# Patient Record
Sex: Male | Born: 1985 | Race: White | Hispanic: Yes | Marital: Married
Health system: Southern US, Community
[De-identification: ages and names within clinical notes are randomized; demographics above are authoritative.]

---

## 2020-08-01 ENCOUNTER — Emergency Department: Payer: Self-pay

## 2020-08-01 ENCOUNTER — Other Ambulatory Visit: Payer: Self-pay

## 2020-08-01 ENCOUNTER — Emergency Department
Admission: EM | Admit: 2020-08-01 | Discharge: 2020-08-01 | Disposition: A | Discharge status: Home / Self Care | Payer: Self-pay | Attending: Emergency Medicine | Admitting: Emergency Medicine

## 2020-08-01 DIAGNOSIS — S9304XA Dislocation of right ankle joint, initial encounter: Secondary | ICD-10-CM | POA: Insufficient documentation

## 2020-08-01 DIAGNOSIS — S82891A Other fracture of right lower leg, initial encounter for closed fracture: Secondary | ICD-10-CM | POA: Insufficient documentation

## 2020-08-01 DIAGNOSIS — W010XXA Fall on same level from slipping, tripping and stumbling without subsequent striking against object, initial encounter: Secondary | ICD-10-CM | POA: Insufficient documentation

## 2020-08-01 DIAGNOSIS — Y92322 Soccer field as the place of occurrence of the external cause: Secondary | ICD-10-CM | POA: Insufficient documentation

## 2020-08-01 DIAGNOSIS — Y9366 Activity, soccer: Secondary | ICD-10-CM | POA: Insufficient documentation

## 2020-08-01 MED ORDER — KETAMINE HCL 10 MG/ML IJ SOLN
INTRAMUSCULAR | Status: AC
  Administered 2020-08-01: 91 mg via INTRAVENOUS
  Filled 2020-08-01: qty 1

## 2020-08-01 MED ORDER — KETAMINE HCL 10 MG/ML IJ SOLN: 1.0000 mg/kg | Freq: Once | INTRAMUSCULAR | Status: AC

## 2020-08-01 MED ORDER — ONDANSETRON HCL 4 MG/2ML IJ SOLN
4.0000 mg | Freq: Once | INTRAMUSCULAR | Status: AC
  Administered 2020-08-01: 4 mg via INTRAVENOUS
  Filled 2020-08-01: qty 2

## 2020-08-01 MED ORDER — OXYCODONE-ACETAMINOPHEN 5-325 MG PO TABS
1.0000 | ORAL_TABLET | Freq: Once | ORAL | Status: AC
  Administered 2020-08-01: 1 via ORAL
  Filled 2020-08-01: qty 1

## 2020-08-01 MED ORDER — OXYCODONE-ACETAMINOPHEN 5-325 MG PO TABS
1.0000 | ORAL_TABLET | Freq: Three times a day (TID) | ORAL | 0 refills | Status: AC | PRN
Start: 2020-08-01 — End: 2020-08-06

## 2020-08-01 MED ORDER — LACTATED RINGERS IV BOLUS
1000.0000 mL | Freq: Once | INTRAVENOUS | Status: AC
  Administered 2020-08-01: 1000 mL via INTRAVENOUS

## 2020-08-01 MED ORDER — OXYCODONE-ACETAMINOPHEN 5-325 MG PO TABS
1.0000 | ORAL_TABLET | Freq: Three times a day (TID) | ORAL | 0 refills | Status: DC | PRN
Start: 2020-08-01 — End: 2020-08-01

## 2020-08-01 NOTE — ED Triage Notes (Signed)
Patient arrived via EMS for ankle injury. Patient was tripped while playing soccer. Ankle splinted by EMS at scene. Patient AOx4.

## 2020-08-01 NOTE — ED Provider Notes (Signed)
[facility] Emergency Department Provider Note  ____________________________________________   First MD Initiated Contact with Patient 08/01/20 1302     (approximate)  I have reviewed the triage vital signs and the nursing notes.   HISTORY  Chief Complaint Ankle Pain   HPI Brent Mcknight is a 34 y.o. male without significant past medical history who presents via EMS from a soccer game after he was tripped and experienced immediate pain and deformity in his right ankle.  Patient denies any other injuries other than pain in his right ankle.  Specifically denies any head injury, neck pain, or pain in his right knee or toes or anywhere else.  He states he is otherwise been in his usual state of health without any recent fevers, chills, cough, nausea, vomiting, diarrhea, dysuria, rash, chest pain, or other symptomatic injuries.  He does not take any daily medicines.  He does not have any known drug allergies.  Patient did receive 150 mcg of fentanyl via EMS prior to arrival.       No past medical history on file.  There are no problems to display for this patient.     Prior to Admission medications   Medication Sig Start Date End Date Taking? Authorizing Provider  oxyCODONE-acetaminophen (PERCOCET) 5-325 MG tablet Take 1 tablet by mouth every 8 (eight) hours as needed for up to 5 days for severe pain. 08/01/20 08/06/20  Gilles Chiquito, MD    Allergies Patient has no known allergies.  No family history on file.  Social History Social History   Tobacco Use  . Smoking status: Not on file  Substance Use Topics  . Alcohol use: Not on file  . Drug use: Not on file    Review of Systems  Review of Systems  Constitutional: Negative for chills and fever.  HENT: Negative for sore throat.   Eyes: Negative for pain.  Respiratory: Negative for cough and stridor.   Cardiovascular: Negative for chest pain.  Gastrointestinal: Negative for vomiting.   Genitourinary: Negative for dysuria.  Musculoskeletal: Positive for joint pain ( R ankle) and myalgias ( R ankle).  Skin: Negative for rash.  Neurological: Negative for seizures, loss of consciousness and headaches.  Psychiatric/Behavioral: Negative for suicidal ideas.  All other systems reviewed and are negative.     ____________________________________________   PHYSICAL EXAM:  VITAL SIGNS: ED Triage Vitals  Enc Vitals Group     BP 08/01/20 1259 (!) 149/105     Pulse Rate 08/01/20 1259 80     Resp 08/01/20 1259 18     Temp 08/01/20 1259 99.3 F (37.4 C)     Temp Source 08/01/20 1259 Oral     SpO2 08/01/20 1256 97 %     Weight 08/01/20 1300 200 lb (90.7 kg)     Height 08/01/20 1300 5\' 9"  (1.753 m)     Head Circumference --      Peak Flow --      Pain Score 08/01/20 1300 10     Pain Loc --      Pain Edu? --      Excl. in GC? --    Vitals:   08/01/20 1345 08/01/20 1400  BP: (!) 119/95 (!) 120/95  Pulse: 83 88  Resp: 14 11  Temp:    SpO2: 99% 100%   Physical Exam Vitals and nursing note reviewed.  Constitutional:      Appearance: He is well-developed.  HENT:     Head: Normocephalic and atraumatic.  Right Ear: External ear normal.     Left Ear: External ear normal.     Nose: Nose normal.  Eyes:     Conjunctiva/sclera: Conjunctivae normal.  Cardiovascular:     Rate and Rhythm: Normal rate and regular rhythm.     Heart sounds: No murmur heard.   Pulmonary:     Effort: Pulmonary effort is normal. No respiratory distress.     Breath sounds: Normal breath sounds.  Abdominal:     Palpations: Abdomen is soft.     Tenderness: There is no abdominal tenderness.  Musculoskeletal:     Cervical back: Neck supple.  Skin:    General: Skin is warm and dry.     Capillary Refill: Capillary refill takes less than 2 seconds.  Neurological:     Mental Status: He is alert and oriented to person, place, and time.  Psychiatric:        Mood and Affect: Mood normal.      Patient has deformity and significant tenderness about the right ankle.  It is displaced laterally.  He is able to wiggle his toes on command and does have sensation intact on the dorsum of right foot.  He has good cap refill and palpable DP pulse.  There is no injury or overlying skin changes to the right knee otherwise of the right lower extremity.  No open wounds. ____________________________________________   LABS (all labs ordered are listed, but only abnormal results are displayed)  Labs Reviewed - No data to display ____________________________________________  ____________________________________________  RADIOLOGY  ED MD interpretation: Distal fibular fracture with medial dislocation of the tibia relative to the talar dome.  No evidence of fracture in the foot.  Official radiology report(s): DG Tibia/Fibula Right  Result Date: 08/01/2020 CLINICAL DATA:  Post reduction right ankle fracture dislocation. EXAM: RIGHT TIBIA AND FIBULA - 2 VIEW COMPARISON:  08/01/2020 at 1:04 p.m. FINDINGS: Talus has been reduced, no longer dislocated, but with mild residual lateral subluxation in relation to the distal tibia, approximately 4 mm. The fractures of the distal fibula are now well aligned. No other fractures.  Knee joint normally spaced and aligned. IMPRESSION: 1. Successful reduction of the dislocated talus, with mild residual lateral subluxation of 4 mm. Distal fibular fractures are well aligned. 2. No fracture seen above the ankle fractures. Knee joint normally spaced and aligned. Electronically Signed   By: Amie Portland M.D.   On: 08/01/2020 14:01   DG Ankle 2 Views Right  Result Date: 08/01/2020 CLINICAL DATA:  Post reduction, right ankle fractures. EXAM: RIGHT ANKLE - 2 VIEW COMPARISON:  08/01/2020 at 1:04 p.m. FINDINGS: Talus has been mostly reduced, with only mild lateral subluxation persisting of approximately 4 mm. Talus is well aligned with the distal tibia on the lateral view.  There has been significant reduction of the distal fibular fractures, with no significant residual displacement or angulation. Fracture of the posterior rim of the distal tibia is also well aligned. IMPRESSION: 1. Significant reduction of the left ankle fracture dislocation compared to the earlier exam as detailed above. Electronically Signed   By: Amie Portland M.D.   On: 08/01/2020 13:58   DG Ankle 2 Views Right  Result Date: 08/01/2020 CLINICAL DATA:  Soccer injury. EXAM: RIGHT ANKLE - 2 VIEW COMPARISON:  None. FINDINGS: Two view exam of the right ankle shows comminuted fracture of the distal fibula just above the ankle mortise. There is medial dislocation of the tibia relative to the talar dome. No definite fracture  of the distal tibia although assessment limited by positioning and rotation. IMPRESSION: 1. Comminuted distal fibular fracture with medial dislocation of the tibia relative to the talar dome. Electronically Signed   By: Kennith CenterEric  Mansell M.D.   On: 08/01/2020 13:23   DG Foot 2 Views Right  Result Date: 08/01/2020 CLINICAL DATA:  Post reduction, right ankle fracture dislocation. EXAM: RIGHT FOOT - 2 VIEW COMPARISON:  08/01/2020 at 1:04 p.m. FINDINGS: Successful reduction.  Talus is no longer dislocated. No foot fracture. Joints of the right foot are normally spaced and aligned. No arthropathic changes. IMPRESSION: 1. Successful reduction of the dislocated right talus. Please refer to the right ankle radiographs for further details. 2. No right foot fracture or dislocation. Electronically Signed   By: Amie Portlandavid  Ormond M.D.   On: 08/01/2020 14:00    ____________________________________________   PROCEDURES  Procedure(s) performed (including Critical Care):  .Sedation  Date/Time: 08/01/2020 2:57 PM Performed by: Gilles ChiquitoSmith, Cristi Gwynn P, MD Authorized by: Gilles ChiquitoSmith, Adelie Croswell P, MD   Consent:    Consent obtained:  Verbal   Consent given by:  Patient   Risks discussed:  Allergic reaction,  dysrhythmia, inadequate sedation, nausea, vomiting, respiratory compromise necessitating ventilatory assistance and intubation, prolonged sedation necessitating reversal and prolonged hypoxia resulting in organ damage   Alternatives discussed:  Analgesia without sedation Indications:    Procedure performed:  Fracture reduction   Procedure necessitating sedation performed by:  Physician performing sedation Pre-sedation assessment:    NPO status caution: unable to specify NPO status     Neck mobility: normal     Mallampati score:  I - soft palate, uvula, fauces, pillars visible   Pre-sedation assessments completed and reviewed: airway patency, cardiovascular function, hydration status, mental status, nausea/vomiting and respiratory function   Immediate pre-procedure details:    Reassessment: Patient reassessed immediately prior to procedure     Reviewed: vital signs and relevant labs/tests     Verified: bag valve mask available, emergency equipment available, intubation equipment available, oxygen available and suction available   Procedure details (see MAR for exact dosages):    Preoxygenation:  Room air   Sedation:  Ketamine   Intended level of sedation: deep   Intra-procedure monitoring:  Blood pressure monitoring, cardiac monitor, continuous capnometry, continuous pulse oximetry, frequent LOC assessments and frequent vital sign checks   Intra-procedure events: hypoxia     Intra-procedure management:  Supplemental oxygen   Total Provider sedation time (minutes):  15 Post-procedure details:    Attendance: Constant attendance by certified staff until patient recovered     Recovery: Patient returned to pre-procedure baseline     Post-sedation assessments completed and reviewed: airway patency, cardiovascular function, mental status, nausea/vomiting, respiratory function and temperature     Patient tolerance:  Tolerated well, no immediate complications Reduction of dislocation  Date/Time:  08/01/2020 2:59 PM Performed by: Gilles ChiquitoSmith, Demmi Sindt P, MD Authorized by: Gilles ChiquitoSmith, Camarion Weier P, MD  Local anesthesia used: no  Anesthesia: Local anesthesia used: no  Sedation: Patient sedated: yes Sedation type: moderate (conscious) sedation Sedatives: ketamine  Patient tolerance: patient tolerated the procedure well with no immediate complications  .1-3 Lead EKG Interpretation Performed by: Gilles ChiquitoSmith, Amiya Escamilla P, MD Authorized by: Gilles ChiquitoSmith, Corin Formisano P, MD     Interpretation: normal     ECG rate assessment: normal     Rhythm: sinus rhythm     Ectopy: none     Conduction: normal       ____________________________________________   INITIAL IMPRESSION / ASSESSMENT AND PLAN / ED COURSE  Patient presents above to history exam for assessment of right ankle pain and deformity after he was tripped while playing some soccer.  He is afebrile hemodynamically stable on arrival.  He has a gross deformity of the right ankle but has intact DP pulse and sensation.  No other injuries obvious on exam to the foot or knee.  Patient denies any other acute pain.  Prereduction x-ray remarkable for comminuted distal fibular fracture with medial dislocation of the tibia relative to the talar dome.  Patient was sedated with ketamine as noted above and his dislocation was reduced without any complications.  No injury to the foot on plain films.  I did discuss patient with Dr. Rosita Kea orthopedic surgeon who recommended outpatient podiatry follow-up with likely outpatient surgery.  Patient was splinted and given crutches.  Rx written for Percocet.  Instructed to follow-up with podiatry.  Discharge stable condition.  Strict return cautions advised discussed. ____________________________________________   FINAL CLINICAL IMPRESSION(S) / ED DIAGNOSES  Final diagnoses:  Closed fracture of right ankle, initial encounter  Ankle dislocation, right, initial encounter    Medications  ketamine (KETALAR) injection 91 mg (91 mg  Intravenous Given 08/01/20 1319)  lactated ringers bolus 1,000 mL (1,000 mLs Intravenous New Bag/Given 08/01/20 1313)  ondansetron (ZOFRAN) injection 4 mg (4 mg Intravenous Given 08/01/20 1312)  oxyCODONE-acetaminophen (PERCOCET/ROXICET) 5-325 MG per tablet 1 tablet (1 tablet Oral Given 08/01/20 1346)     ED Discharge Orders         Ordered    oxyCODONE-acetaminophen (PERCOCET) 5-325 MG tablet  Every 8 hours PRN        08/01/20 1432           Note:  This document was prepared using Dragon voice recognition software and may include unintentional dictation errors.   Gilles Chiquito, MD 08/01/20 [phone]

## 2021-08-22 IMAGING — DX DG FOOT 2V*R*
2 series · 2 of 2 positions shown · non-contrast
Comparison: 08/01/2020 at [DATE] p.m.

CLINICAL DATA: Post reduction, right ankle fracture dislocation.

EXAM:
RIGHT FOOT - 2 VIEW

[foot ap]
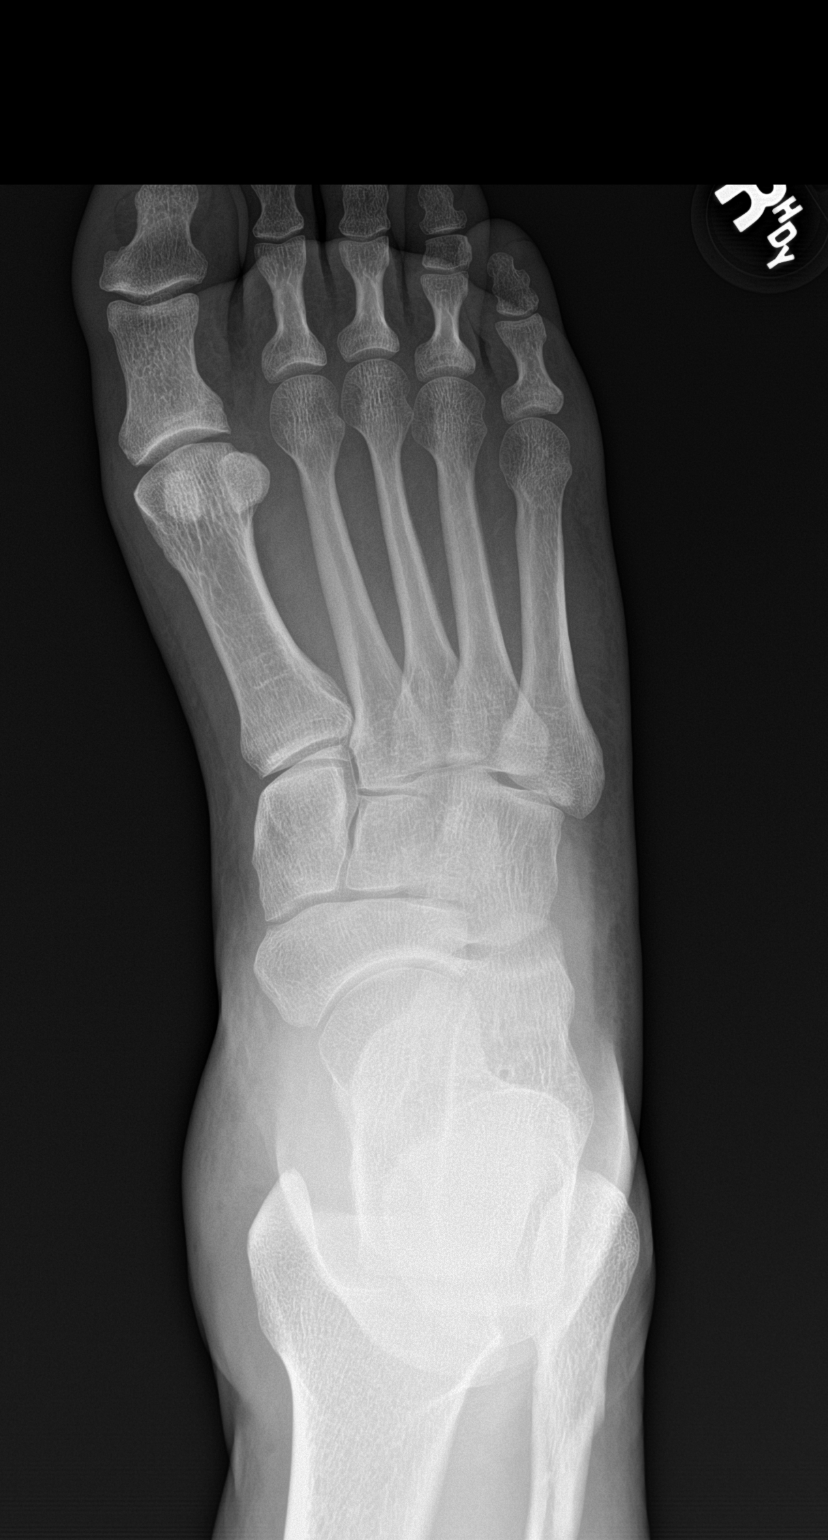

[foot lat]
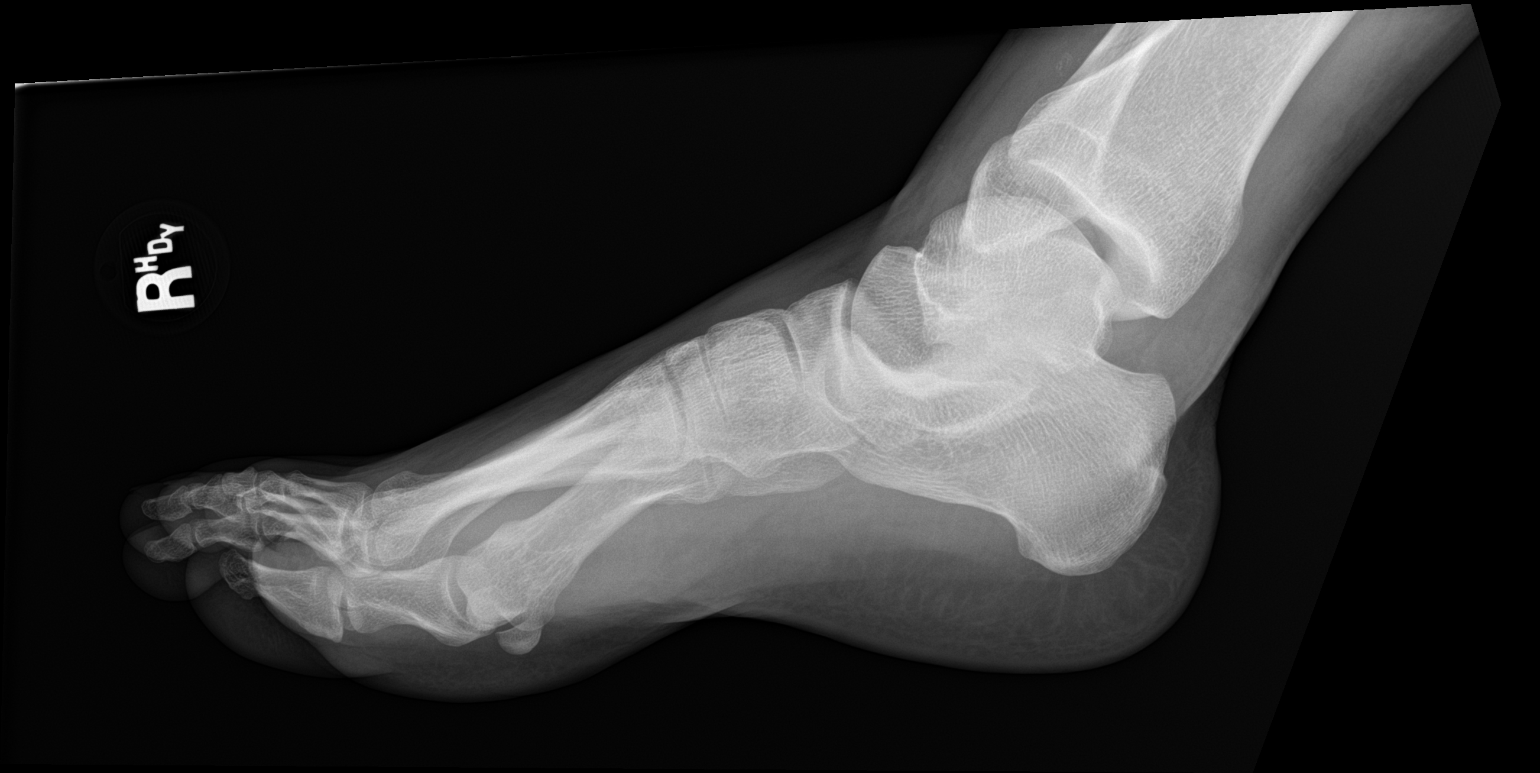

[2 of 2 positions shown; findings below may reference images not displayed]

FINDINGS: Successful reduction.  Talus is no longer dislocated.

No foot fracture. Joints of the right foot are normally spaced and
aligned. No arthropathic changes.
IMPRESSION: 1. Successful reduction of the dislocated right talus. Please refer
to the right ankle radiographs for further details.
2. No right foot fracture or dislocation.

## 2021-08-22 IMAGING — DX DG ANKLE 2V *R*
2 series · 2 of 2 positions shown · non-contrast
Comparison: None.

CLINICAL DATA: Soccer injury.

EXAM:
RIGHT ANKLE - 2 VIEW

[ankle ap]
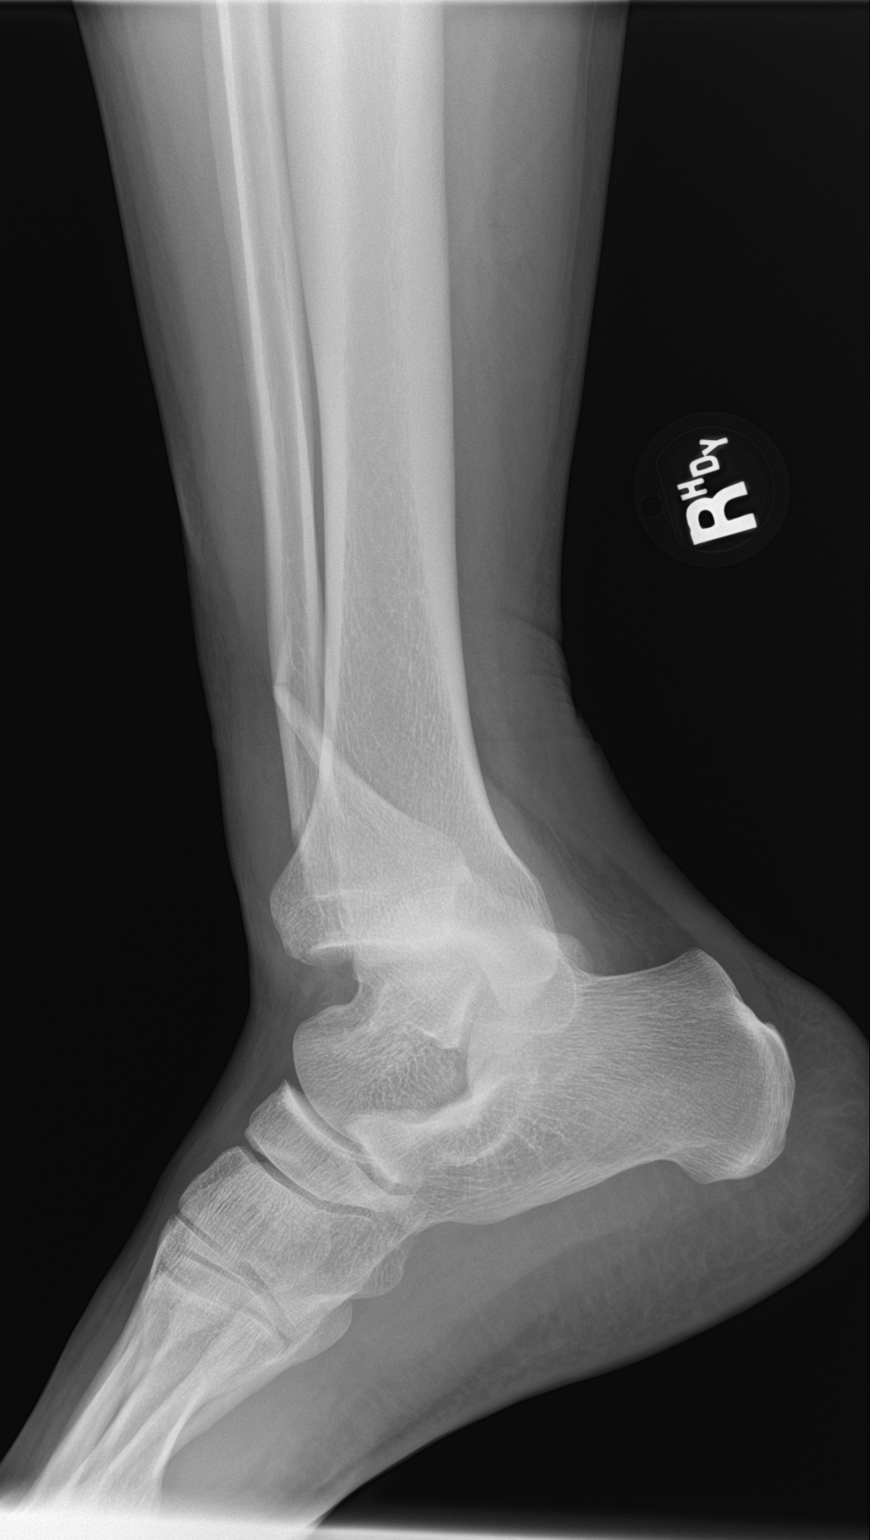

[ankle lat]
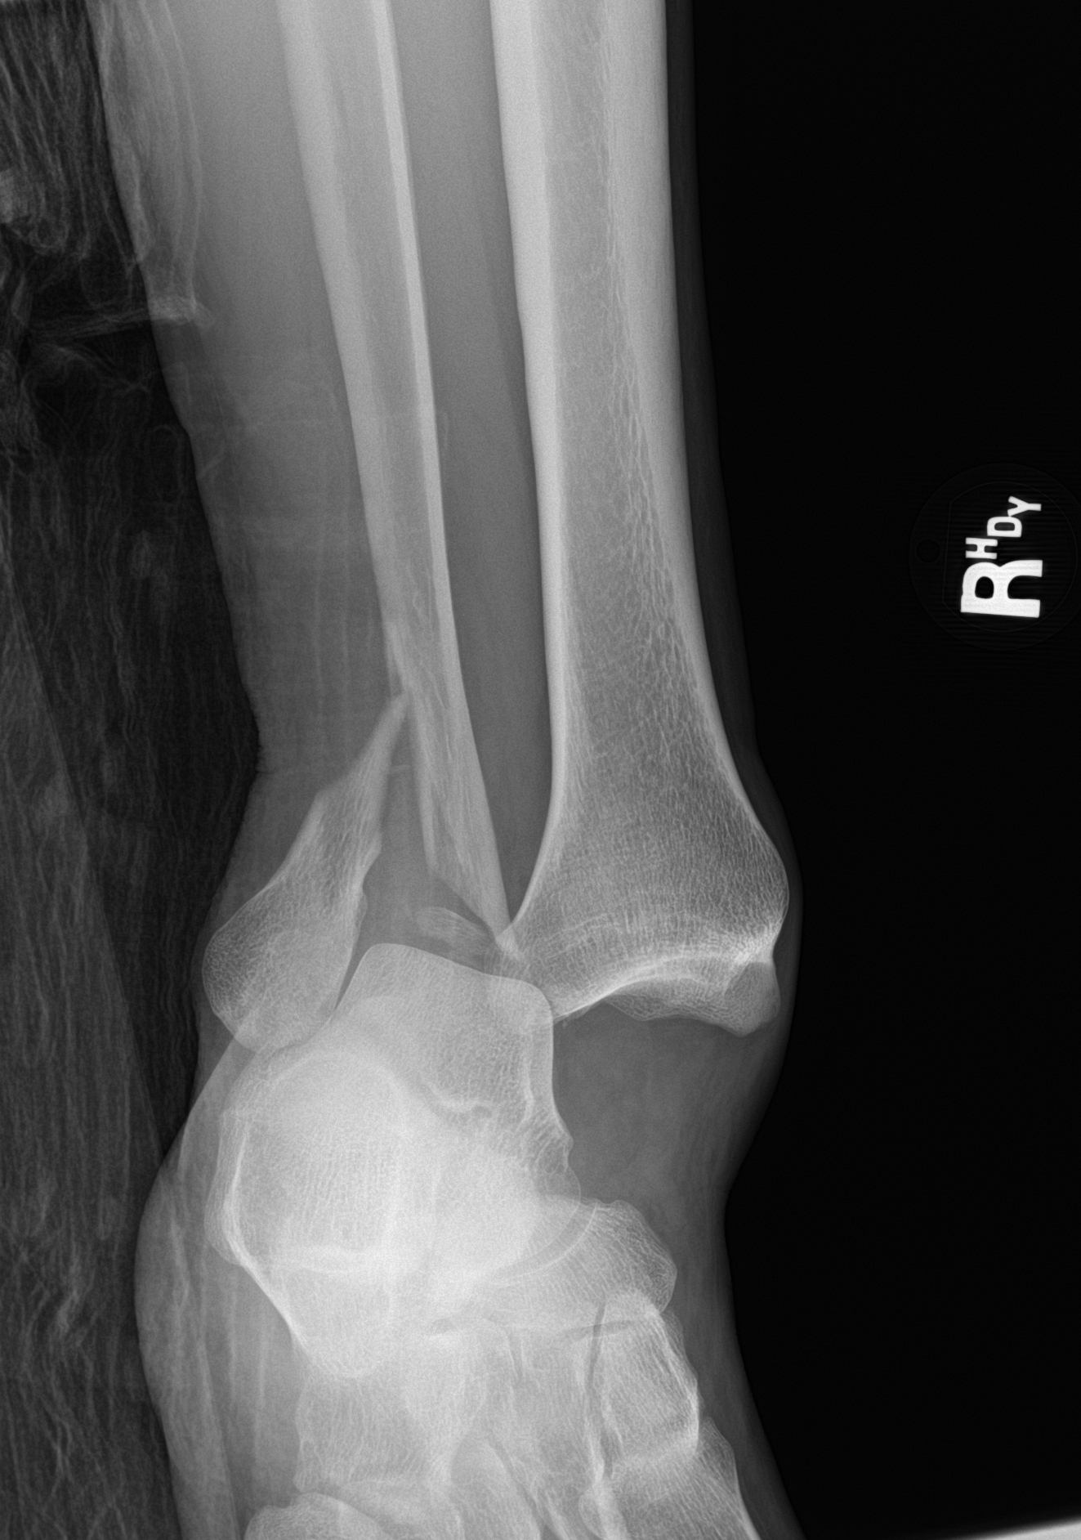

[2 of 2 positions shown; findings below may reference images not displayed]

FINDINGS: Two view exam of the right ankle shows comminuted fracture of the
distal fibula just above the ankle mortise. There is medial
dislocation of the tibia relative to the talar dome. No definite
fracture of the distal tibia although assessment limited by
positioning and rotation.
IMPRESSION: 1. Comminuted distal fibular fracture with medial dislocation of the
tibia relative to the talar dome.
